# Patient Record
Sex: Male | Born: 1975 | Race: White | Hispanic: No | Marital: Married | Smoking: Former smoker
Health system: Southern US, Community
[De-identification: ages and names within clinical notes are randomized; demographics above are authoritative.]

## PROBLEM LIST (undated history)

## (undated) DIAGNOSIS — E7800 Pure hypercholesterolemia, unspecified: Secondary | ICD-10-CM

## (undated) DIAGNOSIS — F419 Anxiety disorder, unspecified: Secondary | ICD-10-CM

## (undated) HISTORY — PX: KNEE SURGERY: SHX244

---

## 2013-05-12 ENCOUNTER — Other Ambulatory Visit: Payer: Self-pay | Admitting: Orthopedic Surgery

## 2013-05-12 DIAGNOSIS — M25512 Pain in left shoulder: Secondary | ICD-10-CM

## 2013-05-15 ENCOUNTER — Ambulatory Visit
Admission: RE | Admit: 2013-05-15 | Discharge: 2013-05-15 | Disposition: A | Discharge status: Home / Self Care | Payer: BC Managed Care – PPO | Source: Ambulatory Visit | Attending: Orthopedic Surgery | Admitting: Orthopedic Surgery

## 2013-05-15 DIAGNOSIS — M25512 Pain in left shoulder: Secondary | ICD-10-CM

## 2014-01-14 ENCOUNTER — Encounter (HOSPITAL_BASED_OUTPATIENT_CLINIC_OR_DEPARTMENT_OTHER): Payer: Self-pay | Admitting: Emergency Medicine

## 2014-01-14 ENCOUNTER — Emergency Department (HOSPITAL_BASED_OUTPATIENT_CLINIC_OR_DEPARTMENT_OTHER): Payer: BC Managed Care – PPO

## 2014-01-14 ENCOUNTER — Emergency Department (HOSPITAL_BASED_OUTPATIENT_CLINIC_OR_DEPARTMENT_OTHER)
Admission: EM | Admit: 2014-01-14 | Discharge: 2014-01-14 | Disposition: A | Discharge status: Home / Self Care | Payer: BC Managed Care – PPO | Attending: Emergency Medicine | Admitting: Emergency Medicine

## 2014-01-14 DIAGNOSIS — Y9389 Activity, other specified: Secondary | ICD-10-CM | POA: Insufficient documentation

## 2014-01-14 DIAGNOSIS — Y9289 Other specified places as the place of occurrence of the external cause: Secondary | ICD-10-CM | POA: Insufficient documentation

## 2014-01-14 DIAGNOSIS — S61219A Laceration without foreign body of unspecified finger without damage to nail, initial encounter: Secondary | ICD-10-CM

## 2014-01-14 DIAGNOSIS — F419 Anxiety disorder, unspecified: Secondary | ICD-10-CM | POA: Diagnosis not present

## 2014-01-14 DIAGNOSIS — S61215A Laceration without foreign body of left ring finger without damage to nail, initial encounter: Secondary | ICD-10-CM | POA: Insufficient documentation

## 2014-01-14 DIAGNOSIS — Z79899 Other long term (current) drug therapy: Secondary | ICD-10-CM | POA: Insufficient documentation

## 2014-01-14 DIAGNOSIS — K219 Gastro-esophageal reflux disease without esophagitis: Secondary | ICD-10-CM | POA: Insufficient documentation

## 2014-01-14 DIAGNOSIS — Z8639 Personal history of other endocrine, nutritional and metabolic disease: Secondary | ICD-10-CM | POA: Insufficient documentation

## 2014-01-14 DIAGNOSIS — W270XXA Contact with workbench tool, initial encounter: Secondary | ICD-10-CM | POA: Diagnosis not present

## 2014-01-14 HISTORY — DX: Pure hypercholesterolemia, unspecified: E78.00

## 2014-01-14 HISTORY — DX: Anxiety disorder, unspecified: F41.9

## 2014-01-14 MED ORDER — IBUPROFEN 800 MG PO TABS
800.0000 mg | ORAL_TABLET | Freq: Once | ORAL | Status: AC
  Administered 2014-01-14: 800 mg via ORAL
  Filled 2014-01-14: qty 1

## 2014-01-14 NOTE — ED Provider Notes (Addendum)
CSN: [id number]     Arrival date & time 01/14/14  1758 History  This chart was scribed for Hilario Quarryanielle S Michaline Kindig, MD by Tonye RoyaltyJoshua Chen, ED Scribe. This patient was seen in room MH04/MH04 and the patient's care was started at 6:21 PM.    Chief Complaint  Patient presents with  . Extremity Laceration   Patient is a 38 y.o. male presenting with skin laceration. The history is provided by the patient. No language interpreter was used.  Laceration Location:  Finger Finger laceration location:  L ring finger Bleeding: controlled   Injury mechanism: table saw. Pain details:    Severity:  Moderate   Timing:  Constant Foreign body present:  No foreign bodies Relieved by:  Nothing Worsened by:  Nothing tried Ineffective treatments:  None tried Tetanus status:  Up to date  HPI Comments: Johnathan PearlRobert Griffin is a 38 y.o. male who presents to the Emergency Department complaining of 4th left finger laceration when he cut it on a table saw just PTA. He reports decreased sensation to the superior aspect of that finger, though he notes a significant amount of wood glue on his finger. He states he is left handed. He states he had tetanus shot a few weeks ago after he injured his left palm with a drill. He denies significant chronic health problems besides hyperlipidemia for which he does not take medication. He states he takes medication for acid reflux and anxiety. He states his PCP is Dr. Dareen PianoAnderson.  Past Medical History  Diagnosis Date  . Anxiety   . Hypercholesteremia    History reviewed. No pertinent past surgical history. No family history on file. History  Substance Use Topics  . Smoking status: Never Smoker   . Smokeless tobacco: Not on file  . Alcohol Use: Yes     Comment: daily beer/liquor    Review of Systems  Skin: Positive for wound.  Neurological: Positive for numbness.  All other systems reviewed and are negative.     Allergies  Review of patient's allergies indicates not on file.  Home  Medications   Prior to Admission medications   Medication Sig Start Date End Date Taking? Authorizing Provider  ALPRAZolam Prudy Feeler(XANAX) 0.25 MG tablet Take 0.25 mg by mouth at bedtime as needed for anxiety.   Yes Historical Provider, MD  cetirizine (ZYRTEC) 10 MG tablet Take 10 mg by mouth daily.   Yes Historical Provider, MD  escitalopram (LEXAPRO) 10 MG tablet Take 10 mg by mouth daily.   Yes Historical Provider, MD  Multiple Vitamin (MULTIVITAMIN) capsule Take 1 capsule by mouth daily.   Yes Historical Provider, MD  omeprazole (PRILOSEC) 20 MG capsule Take 20 mg by mouth daily.   Yes Historical Provider, MD   There were no vitals taken for this visit. Physical Exam  Nursing note and vitals reviewed. Constitutional: He is oriented to person, place, and time. He appears well-developed and well-nourished. No distress.  HENT:  Head: Normocephalic and atraumatic.  Right Ear: External ear normal.  Left Ear: External ear normal.  Nose: Nose normal.  Eyes: EOM are normal.  Neck: Normal range of motion. Neck supple.  Musculoskeletal: Normal range of motion.  Neurological: He is alert and oriented to person, place, and time. He exhibits normal muscle tone. Coordination normal.  Skin: Skin is warm and dry.  Laceration to tip of left ringfinger  Psychiatric: He has a normal mood and affect. His behavior is normal. Thought content normal.    ED Course  Procedures (including  critical care time)  LACERATION REPAIR Performed by: Hilario Quarryanielle S. Jathan Balling, MD Consent: Verbal consent obtained. Risks and benefits: risks, benefits and alternatives were discussed Patient identity confirmed: provided demographic data Time out performed prior to procedure Prepped and Draped in normal sterile fashion Wound explored Laceration Location: tip of left 4th finger Laceration Length: 2cm No Foreign Bodies seen or palpated Anesthesia: local infiltration Local anesthetic: lidocaine 1% without epinephrine Anesthetic  total: 2 ml Irrigation method: syringe Amount of cleaning: standard Skin closure: prolene Number of sutures: 5 Technique: simple interrupted Patient tolerance: Patient tolerated the procedure well with no immediate complications.   Labs Review Labs Reviewed - No data to display  Imaging Review Dg Finger Ring Left  01/14/2014   CLINICAL DATA:  Table saw injury of left ring finger  EXAM: LEFT RING FINGER 2+V  COMPARISON:  None.  FINDINGS: Three views of left fourth finger submitted. There is soft tissue injury at the tip of the finger. No acute fracture or subluxation.  IMPRESSION: Soft tissue injury at the tip of the finger. No acute fracture or subluxation.   Electronically Signed   By: Natasha MeadLiviu  Pop M.D.   On: 01/14/2014 19:14     EKG Interpretation None     DIAGNOSTIC STUDIES: Oxygen Saturation is 99% on room air, normal by my interpretation.    COORDINATION OF CARE: 6:33 PM Administered lidocaine to his left ringfinger; will obtain x-Avarae Zwart before further laceration management. He states he would like something for pain but states he has had alcohol and Xanax so I will order Ibuprofen. He states he takes 0.25mg  Xanax every 3-4 weeks as needed.  7:39 PM His x-Cinzia Devos reveals no evidence of acute fracture or bone damage. Wound repaired with sutures. Instructed the patient to follow up with his PCP on Monday or Tuesday. Also instructed him not to build furniture for 10-14 days. I did not prescribe an antibiotic since he usually does not tolerate them well and the wound is clean and was well-irrigated.   MDM   Final diagnoses:  Laceration of finger    I personally performed the services described in this documentation, which was scribed in my presence. The recorded information has been reviewed and considered.   Hilario Quarryanielle S Jacksen Isip, MD 01/16/14 [phone]  Hilario Quarryanielle S Annmarie Plemmons, MD 01/16/14 [phone]

## 2014-01-14 NOTE — ED Notes (Addendum)
Patient has left ring finger lac from table saw. Bleeding controlled at triage. Tetanus UTD

## 2014-01-14 NOTE — Discharge Instructions (Signed)
Recheck with your doctor in 2 days.  Sutures out in 7-10 days  Fingertip Injuries and Amputations Fingertip injuries are common and often get injured because they are last to escape when pulling your hand out of harm's way. You have amputated (cut off) part of your finger. How this turns out depends largely on how much was amputated. If just the tip is amputated, often the end of the finger will grow back and the finger may return to much the same as it was before the injury.  If more of the finger is missing, your caregiver has done the best with the tissue remaining to allow you to keep as much finger as is possible. Your caregiver after checking your injury has tried to leave you with a painless fingertip that has durable, feeling skin. If possible, your caregiver has tried to maintain the finger's length and appearance and preserve its fingernail.  Please read the instructions outlined below and refer to this sheet in the next few weeks. These instructions provide you with general information on caring for yourself. Your caregiver may also give you specific instructions. While your treatment has been done according to the most current medical practices available, unavoidable complications occasionally occur. If you have any problems or questions after discharge, please call your caregiver. HOME CARE INSTRUCTIONS   You may resume normal diet and activities as directed or allowed.  Keep your hand elevated above the level of your heart. This helps decrease pain and swelling.  Keep ice packs (or a bag of ice wrapped in a towel) on the injured area for 15-20 minutes, 03-04 times per day, for the first two days.  Change dressings if necessary or as directed.  Clean the wound daily or as directed.  Only take over-the-counter or prescription medicines for pain, discomfort, or fever as directed by your caregiver.  Keep appointments as directed. SEEK IMMEDIATE MEDICAL CARE IF:  You develop redness,  swelling, numbness or increasing pain in the wound.  There is pus coming from the wound.  You develop an unexplained oral temperature above 102 F (38.9 C) or as your caregiver suggests.  There is a foul (bad) smell coming from the wound or dressing.  There is a breaking open of the wound (edges not staying together) after sutures or staples have been removed. MAKE SURE YOU:   Understand these instructions.  Will watch your condition.  Will get help right away if you are not doing well or get worse. Document Released: 02/12/2005 Document Revised: 06/16/2011 Document Reviewed: 01/12/2008 [facility] Patient Information 2015 [place] [place]. This information is not intended to replace advice given to you by your health care provider. Make sure you discuss any questions you have with your health care provider.

## 2015-03-23 ENCOUNTER — Encounter (HOSPITAL_BASED_OUTPATIENT_CLINIC_OR_DEPARTMENT_OTHER): Payer: Self-pay | Admitting: *Deleted

## 2015-03-23 ENCOUNTER — Emergency Department (HOSPITAL_BASED_OUTPATIENT_CLINIC_OR_DEPARTMENT_OTHER)
Admission: EM | Admit: 2015-03-23 | Discharge: 2015-03-23 | Disposition: A | Discharge status: Home / Self Care | Payer: 59 | Attending: Emergency Medicine | Admitting: Emergency Medicine

## 2015-03-23 DIAGNOSIS — Z79899 Other long term (current) drug therapy: Secondary | ICD-10-CM | POA: Insufficient documentation

## 2015-03-23 DIAGNOSIS — F419 Anxiety disorder, unspecified: Secondary | ICD-10-CM | POA: Diagnosis not present

## 2015-03-23 DIAGNOSIS — E7800 Pure hypercholesterolemia, unspecified: Secondary | ICD-10-CM | POA: Diagnosis not present

## 2015-03-23 DIAGNOSIS — R197 Diarrhea, unspecified: Secondary | ICD-10-CM | POA: Diagnosis not present

## 2015-03-23 DIAGNOSIS — R630 Anorexia: Secondary | ICD-10-CM | POA: Diagnosis not present

## 2015-03-23 DIAGNOSIS — R5383 Other fatigue: Secondary | ICD-10-CM | POA: Insufficient documentation

## 2015-03-23 DIAGNOSIS — R1110 Vomiting, unspecified: Secondary | ICD-10-CM | POA: Insufficient documentation

## 2015-03-23 LAB — URINALYSIS, ROUTINE W REFLEX MICROSCOPIC
Bilirubin Urine: NEGATIVE
GLUCOSE, UA: NEGATIVE mg/dL
Hgb urine dipstick: NEGATIVE
Ketones, ur: NEGATIVE mg/dL
Leukocytes, UA: NEGATIVE
Nitrite: NEGATIVE
Protein, ur: NEGATIVE mg/dL
Specific Gravity, Urine: 1.014 (ref 1.005–1.030)
pH: 5 (ref 5.0–8.0)

## 2015-03-23 LAB — BASIC METABOLIC PANEL
Anion gap: 8 (ref 5–15)
BUN: 12 mg/dL (ref 6–20)
CO2: 25 mmol/L (ref 22–32)
Calcium: 8.9 mg/dL (ref 8.9–10.3)
Chloride: 108 mmol/L (ref 101–111)
Creatinine, Ser: 0.93 mg/dL (ref 0.61–1.24)
GFR calc Af Amer: >60 mL/min (ref >60)
Glucose, Bld: 94 mg/dL (ref 65–99)
Potassium: 4.1 mmol/L (ref 3.5–5.1)
SODIUM: 141 mmol/L (ref 135–145)

## 2015-03-23 LAB — CBC
HCT: 44.2 % (ref 39.0–52.0)
HEMOGLOBIN: 14.9 g/dL (ref 13.0–17.0)
MCH: 30 pg (ref 26.0–34.0)
MCHC: 33.7 g/dL (ref 30.0–36.0)
MCV: 89.1 fL (ref 78.0–100.0)
Platelets: 205 10*3/uL (ref 150–400)
RBC: 4.96 MIL/uL (ref 4.22–5.81)
RDW: 12.6 % (ref 11.5–15.5)
WBC: 4.2 10*3/uL (ref 4.0–10.5)

## 2015-03-23 MED ORDER — SODIUM CHLORIDE 0.9 % IV BOLUS (SEPSIS)
1000.0000 mL | Freq: Once | INTRAVENOUS | Status: AC
  Administered 2015-03-23: 1000 mL via INTRAVENOUS

## 2015-03-23 NOTE — ED Notes (Signed)
Diarrhea for 6 months since his Lexapro dose was increased. Blood in his stool last week and gray stool today.

## 2015-03-23 NOTE — ED Provider Notes (Signed)
CSN: [id number]     Arrival date & time 03/23/15  1859 History  By signing my name below, I, Budd Palmer, attest that this documentation has been prepared under the direction and in the presence of Jerelyn Scott, MD. Electronically Signed: Budd Palmer, ED Scribe. 03/23/2015. 9:19 PM.     Chief Complaint  Patient presents with  . Diarrhea   The history is provided by the patient and a relative. No language interpreter was used.   HPI Comments: Johnathan Griffin is a 39 y.o. male with a PMHx of hypercholesteremia and anxiety who presents to the Emergency Department complaining of diarrhea (15-20x today) onset 6 months ago. Pt states that he has been having 5-6 episodes of diarrhea per day for the past 6 months. Per relative, this began since his dose of Lexapro was increased and he was placed on Lipitor by his PCP 6 months ago. She reports pt having associated bloody stool (one episode, 1 week ago), gray stool (noticed today), loss of appetite, vomiting (1 episode today), and fatigue. She notes pt did not take Lipitor for 2 days with no effect on the diarrhea. She states pt has not seen his PCP for this. She also reports that pt has not eaten today and only a single bottle of water. She states most of the fluids pt has been consuming have been alcoholic. Pt states he has had some wine today, after which he began to feel nauseated. Per relative, pt drinks alcohol daily.   Past Medical History  Diagnosis Date  . Anxiety   . Hypercholesteremia    Past Surgical History  Procedure Laterality Date  . Knee surgery     No family history on file. Social History  Substance Use Topics  . Smoking status: Never Smoker   . Smokeless tobacco: Current User    Types: Snuff  . Alcohol Use: Yes     Comment: daily beer/liquor    Review of Systems  Constitutional: Positive for appetite change and fatigue.  Gastrointestinal: Positive for nausea, vomiting, diarrhea and blood in stool.  All other systems  reviewed and are negative.   Allergies  Review of patient's allergies indicates no known allergies.  Home Medications   Prior to Admission medications   Medication Sig Start Date End Date Taking? Authorizing Provider  Atorvastatin Calcium (LIPITOR PO) Take by mouth.   Yes Historical Provider, MD  ALPRAZolam Prudy Feeler) 0.25 MG tablet Take 0.25 mg by mouth at bedtime as needed for anxiety.    Historical Provider, MD  cetirizine (ZYRTEC) 10 MG tablet Take 10 mg by mouth daily.    Historical Provider, MD  escitalopram (LEXAPRO) 10 MG tablet Take 10 mg by mouth daily.    Historical Provider, MD  Multiple Vitamin (MULTIVITAMIN) capsule Take 1 capsule by mouth daily.    Historical Provider, MD  omeprazole (PRILOSEC) 20 MG capsule Take 20 mg by mouth daily.    Historical Provider, MD   BP 117/91 mmHg  Pulse 71  Temp(Src) 98.3 F (36.8 C) (Oral)  Resp 18  Ht  (1.778 m)  Wt 200 lb (90.719 kg)  BMI 28.70 kg/m2  SpO2 96%  Vitals reviewed Physical Exam  Physical Examination: General appearance - alert, well appearing, and in no distress Mental status - alert, oriented to person, place, and time Eyes -no conjunctival injection, no scleral icterus Mouth - mucous membranes moist, pharynx normal without lesions Chest - clear to auscultation, no wheezes, rales or rhonchi, symmetric air entry Heart - normal  rate, regular rhythm, normal S1, S2, no murmurs, rubs, clicks or gallops Abdomen - soft, nontender, nondistended, no masses or organomegaly, nabs Neurological - alert, oriented, normal speech Extremities - peripheral pulses normal, no pedal edema, no clubbing or cyanosis Skin - normal coloration and turgor, no rashes  ED Course  Procedures  DIAGNOSTIC STUDIES: Oxygen Saturation is 99% on RA, normal by my interpretation.    COORDINATION OF CARE: 9:10 PM - Discussed plans to order diagnostic studies and IV fluids. Advised to f/u with PCP as well. Pt advised of plan for treatment and pt  agrees.  Labs Review Labs Reviewed  CBC  BASIC METABOLIC PANEL  URINALYSIS, ROUTINE W REFLEX MICROSCOPIC (NOT AT [facility])    Imaging Review No results found. I have personally reviewed and evaluated these images and lab results as part of my medical decision-making.   EKG Interpretation None      MDM   Final diagnoses:  Diarrhea, unspecified type    Pt presenting with c/o ongoing diarrhea over the past 6 months.  Today feels more fatigued than usual.  Labs are reassuring, no electrolyte abnormalities, no anemia.  Pt given IV fluids as well.  Advised f/u with PMD.  Abdominal exam is reassuring.  Discharged with strict return precautions.  Pt agreeable with plan.  I personally performed the services described in this documentation, which was scribed in my presence. The recorded information has been reviewed and is accurate.    Jerelyn ScottMartha Linker, MD 03/23/15 [phone]

## 2015-03-23 NOTE — Discharge Instructions (Signed)
Return to the ED with any concerns including vomiting and not able to keep down liquids, abdominal pain, fainting, decreased level of alertness/lethargy, or any other alarming symptoms °

## 2015-07-22 IMAGING — CR DG FINGER RING 2+V*L*
3 series · 3 of 3 positions shown · non-contrast
Comparison: None.

CLINICAL DATA: Table saw injury of left ring finger

EXAM:
LEFT RING FINGER 2+V

[view not recorded (1 of 3)]
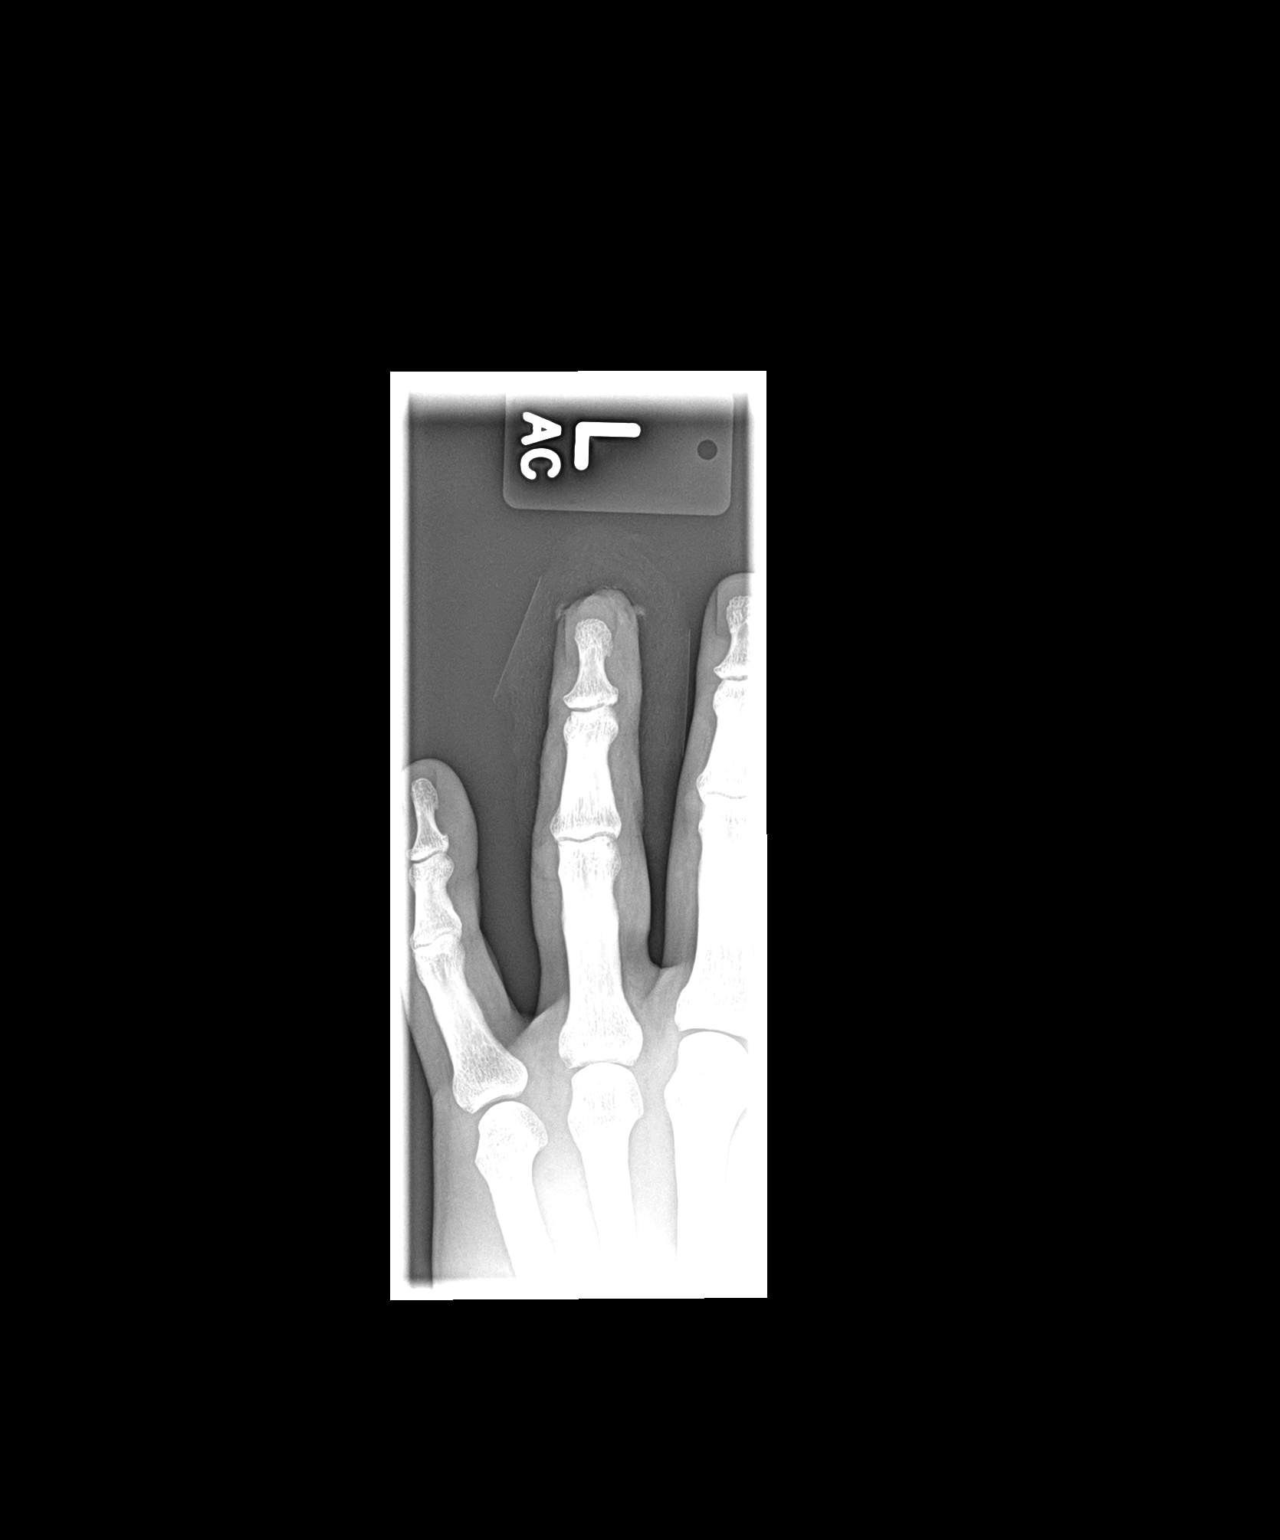

[view not recorded (2 of 3)]
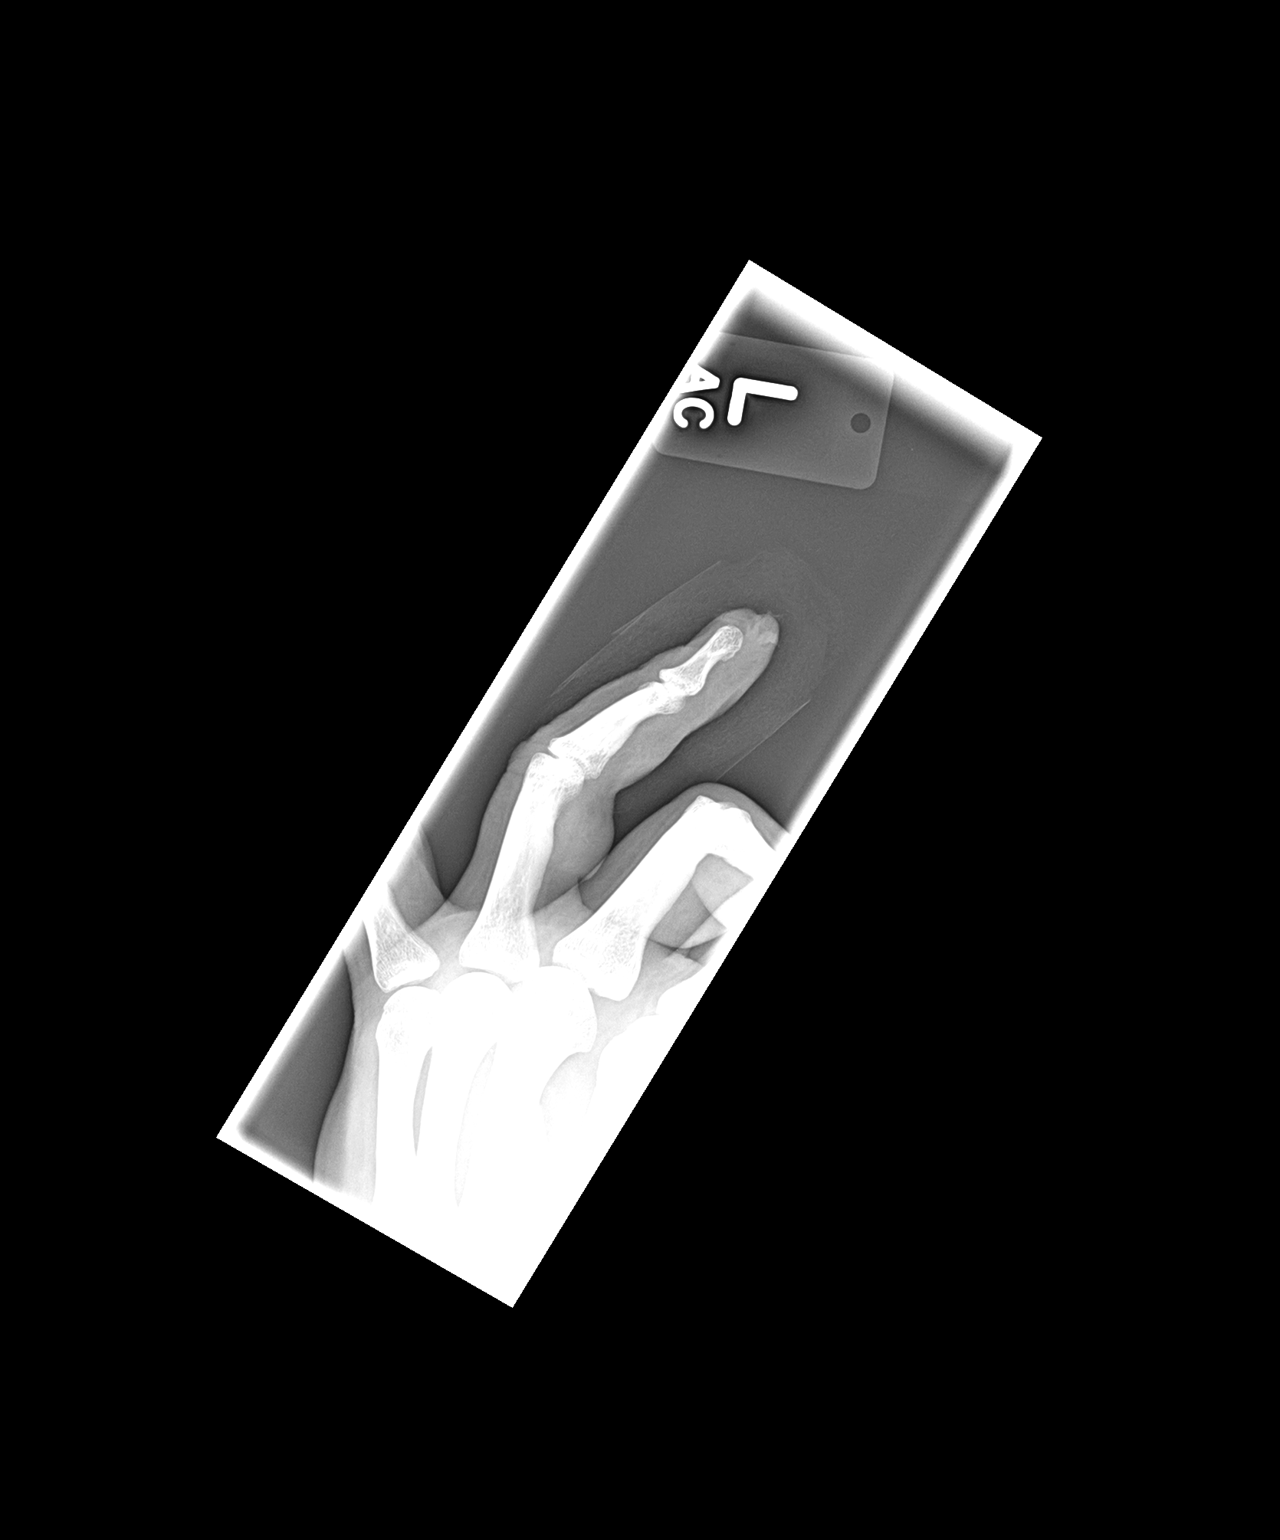

[view not recorded (3 of 3)]
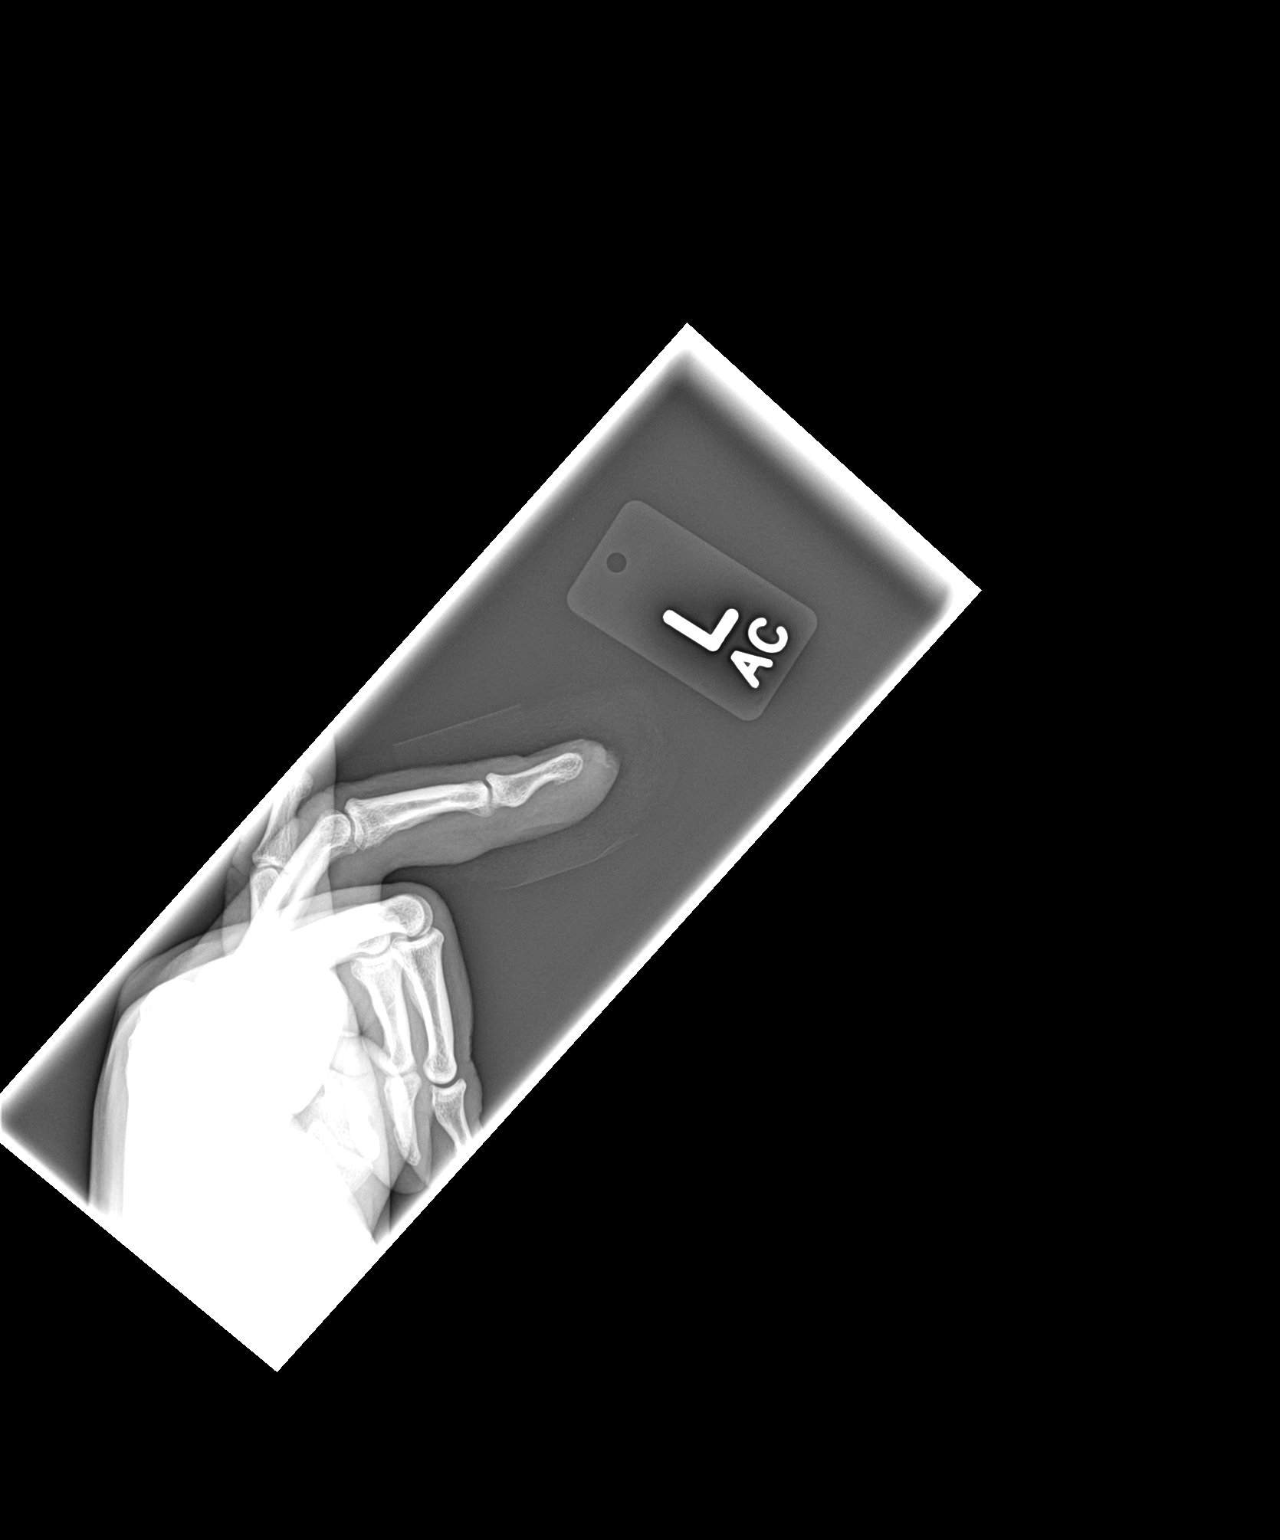

[3 of 3 positions shown; findings below may reference images not displayed]

FINDINGS: Three views of left fourth finger submitted. There is soft tissue
injury at the tip of the finger. No acute fracture or subluxation.
IMPRESSION: Soft tissue injury at the tip of the finger. No acute fracture or
subluxation.

## 2015-10-29 ENCOUNTER — Ambulatory Visit (HOSPITAL_COMMUNITY): Payer: 59 | Admitting: Psychiatry

## 2020-01-23 ENCOUNTER — Ambulatory Visit
Admission: EM | Admit: 2020-01-23 | Discharge: 2020-01-23 | Disposition: A | Discharge status: Home / Self Care | Payer: PRIVATE HEALTH INSURANCE | Attending: Emergency Medicine | Admitting: Emergency Medicine

## 2020-01-23 ENCOUNTER — Other Ambulatory Visit: Payer: Self-pay

## 2020-01-23 ENCOUNTER — Encounter: Payer: Self-pay | Admitting: Emergency Medicine

## 2020-01-23 DIAGNOSIS — J0100 Acute maxillary sinusitis, unspecified: Secondary | ICD-10-CM | POA: Diagnosis not present

## 2020-01-23 MED ORDER — PREDNISONE 10 MG PO TABS: 20.0000 mg | ORAL_TABLET | Freq: Every day | ORAL | 0 refills | Status: DC

## 2020-01-23 MED ORDER — AMOXICILLIN-POT CLAVULANATE 875-125 MG PO TABS: 1.0000 | ORAL_TABLET | Freq: Two times a day (BID) | ORAL | 0 refills | Status: AC

## 2020-01-23 NOTE — ED Triage Notes (Addendum)
Patient c/o sinus pain, pressure and congestion that started 2 weeks ago. He states he has trid several OTC remedies with no relief. Patient declines COVID testing.

## 2020-01-23 NOTE — ED Provider Notes (Signed)
[facility]    CSN: [id number] Arrival date & time: 01/23/20  1124      History   Chief Complaint Chief Complaint  Patient presents with  . Nasal Congestion  . Facial Pain    HPI Johnathan Griffin is a 44 y.o. male.   36-year-old male here for evaluation of nasal congestion sinus pain and yellow nasal discharge x2 weeks.  He also is complaining of ear pressure.  Patient denies fever, dental pain, sore throat, cough, wheezing, or shortness of breath.  Patient has been using a Nettie pot at home which helps to temporarily clear the congestion but then it returns.  He denies any blood in his nasal discharge.  He has a history of sinus infections but has not had to utilize antibiotics in a number of years.     Past Medical History:  Diagnosis Date  . Anxiety   . Hypercholesteremia     There are no problems to display for this patient.   Past Surgical History:  Procedure Laterality Date  . KNEE SURGERY         Home Medications    Prior to Admission medications   Medication Sig Start Date End Date Taking? Authorizing Provider  omeprazole (PRILOSEC) 20 MG capsule Take 20 mg by mouth daily.   Yes [provider]  amoxicillin-clavulanate (AUGMENTIN) 875-125 MG tablet Take 1 tablet by mouth every 12 (twelve) hours for 10 days. 01/23/20 02/02/20  Becky Augusta, NP  predniSONE (DELTASONE) 10 MG tablet Take 2 tablets (20 mg total) by mouth daily. 01/23/20   Becky Augusta, NP  Atorvastatin Calcium (LIPITOR PO) Take by mouth.  01/23/20  [provider]  cetirizine (ZYRTEC) 10 MG tablet Take 10 mg by mouth daily.  01/23/20  [provider]  escitalopram (LEXAPRO) 10 MG tablet Take 10 mg by mouth daily.  01/23/20  [provider]    Family History History reviewed. No pertinent family history.  Social History Social History   Tobacco Use  . Smoking status: Never Smoker  . Smokeless tobacco: Current User    Types: Snuff  Vaping Use   . Vaping Use: Never used  Substance Use Topics  . Alcohol use: Yes    Comment: daily beer/liquor  . Drug use: No     Allergies   Patient has no known allergies.   Review of Systems Review of Systems  Constitutional: Negative for activity change and appetite change.  HENT: Positive for congestion, rhinorrhea, sinus pressure and sinus pain. Negative for dental problem, ear pain and sore throat.   Respiratory: Negative for cough and wheezing.   Cardiovascular: Negative for chest pain.  Gastrointestinal: Negative for diarrhea, nausea and vomiting.  Genitourinary: Negative for dysuria and frequency.  Musculoskeletal: Negative for arthralgias and myalgias.  Skin: Negative for rash.  Neurological: Negative for dizziness and syncope.  Hematological: Negative.   Psychiatric/Behavioral: Negative.      Physical Exam Triage Vital Signs ED Triage Vitals  Enc Vitals Group     BP 01/23/20 1232 (!) 164/107     Pulse Rate 01/23/20 1232 93     Resp 01/23/20 1232 18     Temp 01/23/20 1232 98.4 F (36.9 C)     Temp Source 01/23/20 1232 Oral     SpO2 01/23/20 1232 100 %     Weight 01/23/20 1230 210 lb (95.3 kg)     Height 01/23/20 1230 5\' 10"  (1.778 m)     Head Circumference --  Peak Flow --      Pain Score 01/23/20 1230 4     Pain Loc --      Pain Edu? --      Excl. in GC? --    No data found.  Updated Vital Signs BP (!) 164/107 (BP Location: Right Arm)   Pulse 93   Temp 98.4 F (36.9 C) (Oral)   Resp 18   Ht 5\' 10"  (1.778 m)   Wt 210 lb (95.3 kg)   SpO2 100%   BMI 30.13 kg/m   Visual Acuity Right Eye Distance:   Left Eye Distance:   Bilateral Distance:    Right Eye Near:   Left Eye Near:    Bilateral Near:     Physical Exam Vitals and nursing note reviewed.  Constitutional:      General: He is not in acute distress.    Appearance: Normal appearance. He is not ill-appearing.  HENT:     Head: Normocephalic and atraumatic.     Right Ear: Tympanic  membrane, ear canal and external ear normal. There is no impacted cerumen.     Left Ear: Tympanic membrane, ear canal and external ear normal. There is no impacted cerumen.     Nose: Congestion and rhinorrhea present.     Comments: Nasal mucosa has marked edema and erythema.  The right nare is totally occluded, left nare has moderate patency.  Nasal discharge is thick and glue-like.  Frontal sinuses are not tender to percussion.  Maxillary sinuses are both tender to percussion right greater than left.    Mouth/Throat:     Mouth: Mucous membranes are moist.     Pharynx: Oropharynx is clear. No oropharyngeal exudate or posterior oropharyngeal erythema.  Eyes:     General: No scleral icterus.    Extraocular Movements: Extraocular movements intact.     Conjunctiva/sclera: Conjunctivae normal.     Pupils: Pupils are equal, round, and reactive to light.  Cardiovascular:     Rate and Rhythm: Normal rate and regular rhythm.     Pulses: Normal pulses.     Heart sounds: Normal heart sounds. No murmur heard.  No gallop.   Pulmonary:     Effort: Pulmonary effort is normal.     Breath sounds: Normal breath sounds. No wheezing or rales.  Musculoskeletal:        General: No swelling or tenderness. Normal range of motion.     Cervical back: Normal range of motion and neck supple.  Lymphadenopathy:     Cervical: No cervical adenopathy.  Skin:    General: Skin is warm and dry.     Capillary Refill: Capillary refill takes less than 2 seconds.     Findings: No erythema or rash.  Neurological:     General: No focal deficit present.     Mental Status: He is alert and oriented to person, place, and time.  Psychiatric:        Mood and Affect: Mood normal.        Behavior: Behavior normal.        Thought Content: Thought content normal.        Judgment: Judgment normal.      UC Treatments / Results  Labs (all labs ordered are listed, but only abnormal results are displayed) Labs Reviewed - No data  to display  EKG   Radiology No results found.  Procedures Procedures (including critical care time)  Medications Ordered in UC Medications - No data to display  Initial Impression / Assessment and Plan / UC Course  I have reviewed the triage vital signs and the nursing notes.  Pertinent labs & imaging results that were available during my care of the patient were reviewed by me and considered in my medical decision making (see chart for details).   Patient presents with sinus pain and pressure with thick yellow discharge.  He has a history of sinus infections and also allergies.  He has been combating his symptoms at home with a Nettie pot with minimal relief.  Nasal mucosa is markedly edematous and erythematous.  The discharge is thick and tenacious.  There is also a foul odor on the patient's breath.  Will treat patient with Augmentin and prednisone.  Have him continue home Nettie pot use.  Any new or worsening symptoms he can follow-up with his PCP.   Final Clinical Impressions(s) / UC Diagnoses   Final diagnoses:  Acute non-recurrent maxillary sinusitis     Discharge Instructions     Take the Augmentin twice daily with food for 10 days.  Take prednisone 2 tabs daily for 7 days.  Continue your home Nettie pot use.  Increase your oral fluid intake to thin your mucus.  Avoid dairy products for the time being as these will make your mucus thicker.  If your symptoms are not improving follow-up with your primary care doctor.    ED Prescriptions    Medication Sig Dispense Auth. Provider   amoxicillin-clavulanate (AUGMENTIN) 875-125 MG tablet Take 1 tablet by mouth every 12 (twelve) hours for 10 days. 20 tablet Becky Augusta, NP   predniSONE (DELTASONE) 10 MG tablet Take 2 tablets (20 mg total) by mouth daily. 15 tablet Becky Augusta, NP     PDMP not reviewed this encounter.   Becky Augusta, NP 01/23/20 1332

## 2020-01-23 NOTE — Discharge Instructions (Addendum)
Take the Augmentin twice daily with food for 10 days.  Take prednisone 2 tabs daily for 7 days.  Continue your home Nettie pot use.  Increase your oral fluid intake to thin your mucus.  Avoid dairy products for the time being as these will make your mucus thicker.  If your symptoms are not improving follow-up with your primary care doctor.

## 2021-09-10 ENCOUNTER — Encounter: Payer: Self-pay | Admitting: Adult Health

## 2021-09-10 ENCOUNTER — Ambulatory Visit (INDEPENDENT_AMBULATORY_CARE_PROVIDER_SITE_OTHER): Payer: BC Managed Care – PPO | Admitting: Adult Health

## 2021-09-10 VITALS — BP 138/76 | HR 86 | Temp 98.5°F | Wt 204.0 lb

## 2021-09-10 DIAGNOSIS — R0683 Snoring: Secondary | ICD-10-CM | POA: Diagnosis not present

## 2021-09-10 DIAGNOSIS — J310 Chronic rhinitis: Secondary | ICD-10-CM | POA: Insufficient documentation

## 2021-09-10 NOTE — Assessment & Plan Note (Signed)
Chronic allergy symptoms since chronic allergy symptoms change Claritin to Allegra daily.  Add in saline nasal rinses.  Add in chlor tabs at bedtime If not improved on return consider allergy testing with allergy profile and CBC with differential.  May consider Singulair.  Plan  Patient Instructions  Set up for home sleep study  Healthy sleep regimen  Do not drive if sleepy  Work on healthy weight loss  Change Claritin to [employer] daily .  Chlorpheniramine 4mg  At bedtime  As needed  drainage (Chlor tabs)  Saline nasal rinses Twice daily   Saline nasal gel At bedtime .  Follow up in 6 weeks to discuss results and treatment plan    -

## 2021-09-10 NOTE — Assessment & Plan Note (Signed)
Snoring, restless sleep, witnessed apneic events, daytime sleepiness, BMI 29 all suspicious for underlying sleep apnea.  Patient education on sleep apnea, healthy sleep regimen  - discussed how weight can impact sleep and risk for sleep disordered breathing - discussed options to assist with weight loss: combination of diet modification, cardiovascular and strength training exercises   - had an extensive discussion regarding the adverse health consequences related to untreated sleep disordered breathing - specifically discussed the risks for hypertension, coronary artery disease, cardiac dysrhythmias, cerebrovascular disease, and diabetes - lifestyle modification discussed   - discussed how sleep disruption can increase risk of accidents, particularly when driving - safe driving practices were discussed   We will set patient up for home sleep study.  Plan  Patient Instructions  Set up for home sleep study  Healthy sleep regimen  Do not drive if sleepy  Work on healthy weight loss  Change Claritin to [employer] daily .  Chlorpheniramine 4mg  At bedtime  As needed  drainage (Chlor tabs)  Saline nasal rinses Twice daily   Saline nasal gel At bedtime .  Follow up in 6 weeks to discuss results and treatment plan

## 2021-09-10 NOTE — Patient Instructions (Signed)
Set up for home sleep study  Healthy sleep regimen  Do not drive if sleepy  Work on healthy weight loss  Change Claritin to [employer] daily .  Chlorpheniramine 4mg  At bedtime  As needed  drainage (Chlor tabs)  Saline nasal rinses Twice daily   Saline nasal gel At bedtime .  Follow up in 6 weeks to discuss results and treatment plan

## 2021-09-10 NOTE — Progress Notes (Signed)
@Patient  ID: , male    DOB: April 30, 1975, 46 y.o.   MRN: [id number]  Chief Complaint  Patient presents with   Consult    Referring provider: [id number], Ladora Daniel  HPI: 46 year old male seen for sleep consult September 10, 2021 for snoring witnessed apneic, restless sleep and daytime sleepiness  TEST/EVENTS :   09/10/2021 Sleep consult  Patient presents for a sleep consult today.  Kindly referred by primary care provider 11/10/2021, PA.  Patient complains of chronic snoring, restless sleep, witnessed apneic events, multiple awakenings and restless sleep, daytime sleepiness.  Patient says he goes to sleep about 10 to 11 PM.  Takes him a little while to go to sleep.  But is up multiple hours feels that he only sleeps for an hour at a time he is constantly waking his self up snoring or gasping for air.  Typically gets up about 6 to 7 AM.  He does not operate heavy machinery for work.  Does not take any caffeine.  Patient says he had does have an anxiety disorder and a lot of times when he wakes up he does feel anxious because he feels like he cannot catch his breath.  Patient says he has tried NyQuil and sleep medicines over the counter without significant treatment.  Typically can sleep for about 4 hours but frequently awakens up and feels by the next day.  Patient denies any symptoms suspicious for cataplexy or sleep paralysis.  Epworth score is 11.  Gets sleepy with inactivity. Does complain he has chronic allergies with nasal congestion stuffiness and postnasal drip.  Is taking Claritin without much relief.  Feels that this makes his snoring worse at night.  Medical history significant for anxiety disorder, GERD, hyperlipidemia, chronic allergies,  Surgical history none  Social history patient is married.  Has 2 children both age 45.  Works in [employer].  Patient says he is active tries to exercise most days.  Quit smoking 2011.  Does drink on average 4-6 beers daily.  Says that this helps with  his nighttime anxiety.  Family history positive for allergies and cancer.     Allergies  Allergen Reactions   Tramadol Other (See Comments)   Atorvastatin Diarrhea   Rosuvastatin Calcium Diarrhea    Immunization History  Administered Date(s) Administered   Influenza Split 11/05/2012   Tdap 08/15/2013    Past Medical History:  Diagnosis Date   Anxiety    Hypercholesteremia     Tobacco History: Social History   Tobacco Use  Smoking Status Former   Packs/day: 1.00   Types: Cigarettes   Start date: 1993   Quit date: 2011   Years since quitting: 12.4   Passive exposure: Past  Smokeless Tobacco Current   Types: Snuff   Ready to quit: Not Answered Counseling given: Not Answered   Outpatient Medications Prior to Visit  Medication Sig Dispense Refill   omeprazole (PRILOSEC) 40 MG capsule Take 1 tablet by mouth daily.     pravastatin (PRAVACHOL) 40 MG tablet SMARTSIG:1 Tablet(s) By Mouth Every Evening     omeprazole (PRILOSEC) 20 MG capsule Take 20 mg by mouth daily.     predniSONE (DELTASONE) 10 MG tablet Take 2 tablets (20 mg total) by mouth daily. 15 tablet 0   No facility-administered medications prior to visit.     Review of Systems:   Constitutional:   No  weight loss, night sweats,  Fevers, chills,  +fatigue, or  lassitude.  HEENT:   No  headaches,  Difficulty swallowing,  Tooth/dental problems, or  Sore throat,                No sneezing, itching, ear ache, nasal congestion, post nasal drip,   CV:  No chest pain,  Orthopnea, PND, swelling in lower extremities, anasarca, dizziness, palpitations, syncope.   GI  No heartburn, indigestion, abdominal pain, nausea, vomiting, diarrhea, change in bowel habits, loss of appetite, bloody stools.   Resp: No shortness of breath with exertion or at rest.  No excess mucus, no productive cough,  No non-productive cough,  No coughing up of blood.  No change in color of mucus.  No wheezing.  No chest wall  deformity  Skin: no rash or lesions.  GU: no dysuria, change in color of urine, no urgency or frequency.  No flank pain, no hematuria   MS:  No joint pain or swelling.  No decreased range of motion.  No back pain.    Physical Exam  BP 138/76 (BP Location: Left Arm, Patient Position: Sitting, Cuff Size: Normal)   Pulse 86   Temp 98.5 F (36.9 C) (Oral)   Wt 204 lb (92.5 kg)   SpO2 100%   BMI 29.27 kg/m   GEN: A/Ox3; pleasant , NAD, well nourished    HEENT:  [place]/AT,  EACs-clear, TMs-wnl, NOSE-clear drainage, THROAT-clear, no lesions, no postnasal drip or exudate noted.  Class III-IV MP airway  NECK:  Supple w/ fair ROM; no JVD; normal carotid impulses w/o bruits; no thyromegaly or nodules palpated; no lymphadenopathy.    RESP  Clear  P & A; w/o, wheezes/ rales/ or rhonchi. no accessory muscle use, no dullness to percussion  CARD:  RRR, no m/r/g, no peripheral edema, pulses intact, no cyanosis or clubbing.  GI:   Soft & nt; nml bowel sounds; no organomegaly or masses detected.   Musco: Warm bil, no deformities or joint swelling noted.   Neuro: alert, no focal deficits noted.    Skin: Warm, no lesions or rashes        BNP No results found for: BNP  ProBNP No results found for: PROBNP  Imaging: No results found.        View : No data to display.          No results found for: NITRICOXIDE      Assessment & Plan:   Snoring Snoring, restless sleep, witnessed apneic events, daytime sleepiness, BMI 29 all suspicious for underlying sleep apnea.  Patient education on sleep apnea, healthy sleep regimen  - discussed how weight can impact sleep and risk for sleep disordered breathing - discussed options to assist with weight loss: combination of diet modification, cardiovascular and strength training exercises   - had an extensive discussion regarding the adverse health consequences related to untreated sleep disordered breathing - specifically discussed  the risks for hypertension, coronary artery disease, cardiac dysrhythmias, cerebrovascular disease, and diabetes - lifestyle modification discussed   - discussed how sleep disruption can increase risk of accidents, particularly when driving - safe driving practices were discussed   We will set patient up for home sleep study.  Plan  Patient Instructions  Set up for home sleep study  Healthy sleep regimen  Do not drive if sleepy  Work on healthy weight loss  Change Claritin to [employer] daily .  Chlorpheniramine 4mg  At bedtime  As needed  drainage (Chlor tabs)  Saline nasal rinses Twice daily   Saline nasal gel At bedtime .  Follow up  in 6 weeks to discuss results and treatment plan      Chronic rhinitis Chronic allergy symptoms since chronic allergy symptoms change Claritin to [employer] daily.  Add in saline nasal rinses.  Add in chlor tabs at bedtime If not improved on return consider allergy testing with allergy profile and CBC with differential.  May consider Singulair.  Plan  Patient Instructions  Set up for home sleep study  Healthy sleep regimen  Do not drive if sleepy  Work on healthy weight loss  Change Claritin to [employer] daily .  Chlorpheniramine 4mg  At bedtime  As needed  drainage (Chlor tabs)  Saline nasal rinses Twice daily   Saline nasal gel At bedtime .  Follow up in 6 weeks to discuss results and treatment plan    -     Rubye Oaksammy Ronnette Rump, NP 09/10/2021

## 2021-11-07 ENCOUNTER — Ambulatory Visit: Payer: BC Managed Care – PPO

## 2021-11-07 DIAGNOSIS — R0683 Snoring: Secondary | ICD-10-CM

## 2021-11-07 DIAGNOSIS — G4733 Obstructive sleep apnea (adult) (pediatric): Secondary | ICD-10-CM | POA: Diagnosis not present

## 2021-11-15 ENCOUNTER — Telehealth: Payer: Self-pay | Admitting: Adult Health

## 2021-11-15 NOTE — Telephone Encounter (Signed)
Called and spoke with pt letting him know that [facility] tried to call him due to the HST needing to be repeated due to pulse ox reading. Stated to pt that I would route this to [facility] and they would reach out to him Monday. Pt verbalized understanding. Routing to [facility].

## 2021-11-18 NOTE — Telephone Encounter (Signed)
11/14/21-left msg for pt Dr request pt repeat hst due to pulse ox reading cr

## 2021-11-22 ENCOUNTER — Ambulatory Visit: Payer: BC Managed Care – PPO

## 2021-11-25 ENCOUNTER — Ambulatory Visit: Payer: BC Managed Care – PPO

## 2021-11-25 DIAGNOSIS — G4733 Obstructive sleep apnea (adult) (pediatric): Secondary | ICD-10-CM

## 2021-11-26 DIAGNOSIS — G4733 Obstructive sleep apnea (adult) (pediatric): Secondary | ICD-10-CM

## 2021-11-28 ENCOUNTER — Telehealth: Payer: Self-pay | Admitting: Adult Health

## 2021-11-28 NOTE — Telephone Encounter (Signed)
Home sleep study done on November 25, 2021 showed severe sleep apnea with AHI 55.3/hour and SPO2 low at 76%. Please keep appointment next week to discuss sleep study results and go over treatment plan

## 2021-11-28 NOTE — Telephone Encounter (Signed)
Called and spoke with patient, provided results/recommendations per Rubye Oaks NP.  He verbalized understanding.  He has an appointment scheduled with Tammy for 12/05/21 at 12 pm, advised to arrive by 11:45 am.  Nothing further needed.

## 2021-12-05 ENCOUNTER — Ambulatory Visit: Payer: BC Managed Care – PPO | Admitting: Adult Health

## 2021-12-05 ENCOUNTER — Encounter: Payer: Self-pay | Admitting: Adult Health

## 2021-12-05 VITALS — BP 152/90 | HR 90 | Temp 98.1°F | Ht 70.0 in | Wt 246.2 lb

## 2021-12-05 DIAGNOSIS — E668 Other obesity: Secondary | ICD-10-CM

## 2021-12-05 DIAGNOSIS — G4733 Obstructive sleep apnea (adult) (pediatric): Secondary | ICD-10-CM | POA: Diagnosis not present

## 2021-12-05 DIAGNOSIS — J310 Chronic rhinitis: Secondary | ICD-10-CM | POA: Diagnosis not present

## 2021-12-05 DIAGNOSIS — R0683 Snoring: Secondary | ICD-10-CM | POA: Diagnosis not present

## 2021-12-05 DIAGNOSIS — G4730 Sleep apnea, unspecified: Secondary | ICD-10-CM | POA: Diagnosis not present

## 2021-12-05 NOTE — Assessment & Plan Note (Signed)
Healthy weight loss discussed 

## 2021-12-05 NOTE — Progress Notes (Signed)
@Patient  ID: , male    DOB: 23-Dec-1975, 46 y.o.   MRN: [id number]  Chief Complaint  Patient presents with   Follow-up    Referring provider: No ref. provider found  HPI: 46 year old male seen for sleep consult September 10, 2021 for snoring, restless sleep and daytime sleepiness found to have severe sleep apnea  TEST/EVENTS :  Home sleep study done on November 25, 2021 showed severe sleep apnea with AHI 55.3/hour and SPO2 low at 76%.  12/05/2021 Follow up : OSA  Patient presents for a 37-month follow-up.  Patient was seen last visit for sleep consult.  He had snoring, restless sleep and daytime sleepiness.  He was set up for home sleep study on November 25, 2021 that showed severe sleep apnea with AHI 55.3/hour and SPO2 low at 76%.  We discussed his sleep study results in detail.  Went over treatment options.  Given that he has such severe sleep apnea would recommend proceeding with CPAP.  Patient is in agreement.   Allergies  Allergen Reactions   Tramadol Other (See Comments)   Atorvastatin Diarrhea   Rosuvastatin Calcium Diarrhea    Immunization History  Administered Date(s) Administered   Influenza Split 11/05/2012   Tdap 08/15/2013    Past Medical History:  Diagnosis Date   Anxiety    Hypercholesteremia     Tobacco History: Social History   Tobacco Use  Smoking Status Former   Packs/day: 1.00   Types: Cigarettes   Start date: 1993   Quit date: 2011   Years since quitting: 12.6   Passive exposure: Past  Smokeless Tobacco Current   Types: Snuff   Ready to quit: Not Answered Counseling given: Not Answered   Outpatient Medications Prior to Visit  Medication Sig Dispense Refill   omeprazole (PRILOSEC) 40 MG capsule Take 1 tablet by mouth daily.     pravastatin (PRAVACHOL) 40 MG tablet SMARTSIG:1 Tablet(s) By Mouth Every Evening     No facility-administered medications prior to visit.     Review of Systems:   Constitutional:   No  weight loss, night  sweats,  Fevers, chills, fatigue, or  lassitude.  HEENT:   No headaches,  Difficulty swallowing,  Tooth/dental problems, or  Sore throat,                No sneezing, itching, ear ache, nasal congestion, post nasal drip,   CV:  No chest pain,  Orthopnea, PND, swelling in lower extremities, anasarca, dizziness, palpitations, syncope.   GI  No heartburn, indigestion, abdominal pain, nausea, vomiting, diarrhea, change in bowel habits, loss of appetite, bloody stools.   Resp: No shortness of breath with exertion or at rest.  No excess mucus, no productive cough,  No non-productive cough,  No coughing up of blood.  No change in color of mucus.  No wheezing.  No chest wall deformity  Skin: no rash or lesions.  GU: no dysuria, change in color of urine, no urgency or frequency.  No flank pain, no hematuria   MS:  No joint pain or swelling.  No decreased range of motion.  No back pain.    Physical Exam  BP (!) 152/90 (BP Location: Left Arm, Patient Position: Sitting, Cuff Size: Large)   Pulse 90   Temp 98.1 F (36.7 C) (Oral)   Ht 5\' 10"  (1.778 m)   Wt 246 lb 3.2 oz (111.7 kg)   SpO2 97%   BMI 35.33 kg/m   GEN: A/Ox3; pleasant , NAD,  well nourished    HEENT:  [place]/AT,  NOSE-clear, THROAT-clear, no lesions, no postnasal drip or exudate noted. Class 3-4 MP airway   NECK:  Supple w/ fair ROM; no JVD; normal carotid impulses w/o bruits; no thyromegaly or nodules palpated; no lymphadenopathy.    RESP  Clear  P & A; w/o, wheezes/ rales/ or rhonchi. no accessory muscle use, no dullness to percussion  CARD:  RRR, no m/r/g, no peripheral edema, pulses intact, no cyanosis or clubbing.  GI:   Soft & nt; nml bowel sounds; no organomegaly or masses detected.   Musco: Warm bil, no deformities or joint swelling noted.   Neuro: alert, no focal deficits noted.    Skin: Warm, no lesions or rashes    Lab Results:  CBC  BNP No results found for: "BNP"  ProBNP No results found for:  "PROBNP"  Imaging: No results found.        No data to display          No results found for: "NITRICOXIDE"      Assessment & Plan:   OSA (obstructive sleep apnea) Severe obstructive sleep apnea-patient is occasional sleep apnea and treatment options including CPAP.  Patient will proceed with CPAP.  We will begin auto CPAP 5 to 20 cm H2O..  We went over several different mask option patient has a significant amount of facial hair.  Will recommend DreamWear nasal mask.  - discussed how weight can impact sleep and risk for sleep disordered breathing - discussed options to assist with weight loss: combination of diet modification, cardiovascular and strength training exercises   - had an extensive discussion regarding the adverse health consequences related to untreated sleep disordered breathing - specifically discussed the risks for hypertension, coronary artery disease, cardiac dysrhythmias, cerebrovascular disease, and diabetes - lifestyle modification discussed   - discussed how sleep disruption can increase risk of accidents, particularly when driving - safe driving practices were discussed   Plan  Patient Instructions  Begin CPAP at bedtime.  Goal is to wear CPAP all night long for at least 6 or more hours Dream wear nasal mask .  Saline nasal rinses Twice daily   Saline nasal gel At bedtime .  Continue on Claritin daily .  Chlorpheniramine 4mg  At bedtime  As needed  drainage (Chlor tabs)  Healthy weight loss Do not drive if sleepy. Healthy sleep regimen Follow-up in 3 months and as needed     Chronic rhinitis Continue on current regimen  Moderate obesity Healthy weight loss discussed     , NP 12/05/2021

## 2021-12-05 NOTE — Assessment & Plan Note (Signed)
Continue on current regimen .   

## 2021-12-05 NOTE — Patient Instructions (Addendum)
Begin CPAP at bedtime.  Goal is to wear CPAP all night long for at least 6 or more hours Dream wear nasal mask .  Saline nasal rinses Twice daily   Saline nasal gel At bedtime .  Continue on Claritin daily .  Chlorpheniramine 4mg  At bedtime  As needed  drainage (Chlor tabs)  Healthy weight loss Do not drive if sleepy. Healthy sleep regimen Follow-up in 3 months and as needed

## 2021-12-05 NOTE — Assessment & Plan Note (Signed)
Severe obstructive sleep apnea-patient is occasional sleep apnea and treatment options including CPAP.  Patient will proceed with CPAP.  We will begin auto CPAP 5 to 20 cm H2O..  We went over several different mask option patient has a significant amount of facial hair.  Will recommend DreamWear nasal mask.  - discussed how weight can impact sleep and risk for sleep disordered breathing - discussed options to assist with weight loss: combination of diet modification, cardiovascular and strength training exercises   - had an extensive discussion regarding the adverse health consequences related to untreated sleep disordered breathing - specifically discussed the risks for hypertension, coronary artery disease, cardiac dysrhythmias, cerebrovascular disease, and diabetes - lifestyle modification discussed   - discussed how sleep disruption can increase risk of accidents, particularly when driving - safe driving practices were discussed   Plan  Patient Instructions  Begin CPAP at bedtime.  Goal is to wear CPAP all night long for at least 6 or more hours Dream wear nasal mask .  Saline nasal rinses Twice daily   Saline nasal gel At bedtime .  Continue on Claritin daily .  Chlorpheniramine 4mg  At bedtime  As needed  drainage (Chlor tabs)  Healthy weight loss Do not drive if sleepy. Healthy sleep regimen Follow-up in 3 months and as needed

## 2021-12-10 NOTE — Progress Notes (Signed)
Reviewed and agree with assessment/plan.   Elan Mcelvain, MD [facility] 12/10/2021, 8:08 AM Pager:  [phone]  

## 2023-06-12 ENCOUNTER — Encounter: Payer: Self-pay | Admitting: Adult Health
